# Patient Record
Sex: Female | Born: 1950 | Race: White | Hispanic: No | Marital: Married | Smoking: Never smoker
Health system: Southern US, Community
[De-identification: ages and names within clinical notes are randomized; demographics above are authoritative.]

---

## 2009-09-27 ENCOUNTER — Other Ambulatory Visit: Admission: RE | Admit: 2009-09-27 | Discharge: 2009-09-27 | Payer: Self-pay | Admitting: Family Medicine

## 2017-03-20 ENCOUNTER — Other Ambulatory Visit (HOSPITAL_COMMUNITY)
Admission: RE | Admit: 2017-03-20 | Discharge: 2017-03-20 | Disposition: A | Discharge status: Home / Self Care | Payer: 59 | Source: Ambulatory Visit | Attending: Family Medicine | Admitting: Family Medicine

## 2017-03-20 ENCOUNTER — Other Ambulatory Visit: Payer: Self-pay | Admitting: Family Medicine

## 2017-03-20 DIAGNOSIS — Z01411 Encounter for gynecological examination (general) (routine) with abnormal findings: Secondary | ICD-10-CM | POA: Diagnosis present

## 2017-03-20 DIAGNOSIS — R1310 Dysphagia, unspecified: Secondary | ICD-10-CM

## 2017-03-21 ENCOUNTER — Other Ambulatory Visit: Payer: Self-pay | Admitting: Family Medicine

## 2017-03-21 DIAGNOSIS — Z1231 Encounter for screening mammogram for malignant neoplasm of breast: Secondary | ICD-10-CM

## 2017-03-24 ENCOUNTER — Ambulatory Visit
Admission: RE | Admit: 2017-03-24 | Discharge: 2017-03-24 | Disposition: A | Discharge status: Home / Self Care | Payer: 59 | Source: Ambulatory Visit | Attending: Family Medicine | Admitting: Family Medicine

## 2017-03-24 DIAGNOSIS — R1310 Dysphagia, unspecified: Secondary | ICD-10-CM

## 2017-03-24 LAB — CYTOLOGY - PAP
Diagnosis: NEGATIVE
HPV (WINDOPATH): NOT DETECTED

## 2017-04-22 ENCOUNTER — Encounter: Payer: Self-pay | Admitting: Radiology

## 2017-04-22 ENCOUNTER — Ambulatory Visit
Admission: RE | Admit: 2017-04-22 | Discharge: 2017-04-22 | Disposition: A | Discharge status: Home / Self Care | Payer: 59 | Source: Ambulatory Visit | Attending: Family Medicine | Admitting: Family Medicine

## 2017-04-22 DIAGNOSIS — Z1231 Encounter for screening mammogram for malignant neoplasm of breast: Secondary | ICD-10-CM

## 2020-01-29 ENCOUNTER — Other Ambulatory Visit: Payer: Self-pay

## 2020-01-29 ENCOUNTER — Ambulatory Visit (INDEPENDENT_AMBULATORY_CARE_PROVIDER_SITE_OTHER): Payer: BC Managed Care – PPO

## 2020-01-29 ENCOUNTER — Ambulatory Visit
Admission: RE | Admit: 2020-01-29 | Discharge: 2020-01-29 | Disposition: A | Discharge status: Home / Self Care | Payer: BC Managed Care – PPO | Source: Ambulatory Visit | Attending: Family Medicine | Admitting: Family Medicine

## 2020-01-29 VITALS — BP 136/72 | HR 82 | Temp 99.7°F | Resp 18

## 2020-01-29 DIAGNOSIS — M25561 Pain in right knee: Secondary | ICD-10-CM

## 2020-01-29 DIAGNOSIS — M25461 Effusion, right knee: Secondary | ICD-10-CM

## 2020-01-29 MED ORDER — PREDNISONE 20 MG PO TABS: 40.0000 mg | ORAL_TABLET | Freq: Every day | ORAL | 0 refills | Status: AC

## 2020-01-29 NOTE — ED Triage Notes (Addendum)
Pt sts right sided knee pain x 5 days; pt denies obvious injury

## 2020-01-29 NOTE — Discharge Instructions (Signed)
Follow-up with Dr. Solon Augusta on Monday for evaluation of possible joint effusion drainage.  Take prednisone starting tomorrow with breakfast.  Recommend wearing knee brace with any activity may remove at bedtime.  Also start ice applications this evening and apply ice application for period of 20 minutes twice daily.

## 2020-01-29 NOTE — ED Provider Notes (Addendum)
[facility]    CSN: [id number] Arrival date & time: 01/29/20  1457      History   Chief Complaint Chief Complaint  Patient presents with  . Appointment    1500  . Knee Pain    HPI Rachael Pacheco is a 69 y.o. female.   HPI  Patient presents for evaluation of right lateral knee pain. No known injury. Pain in right present x 5 days. Pain radiates up to lower thigh. 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 No prior issues with recurrent knee pain. Pain is present with weightbearing. She has not  noticed any bruising or obvious bruising.   History reviewed. No pertinent past medical history.  There are no problems to display for this patient.   History reviewed. No pertinent surgical history.  OB History   No obstetric history on file.      Home Medications    Prior to Admission medications   Not on File    Family History Family History  Family history unknown: Yes    Social History Social History   Tobacco Use  . Smoking status: Never Smoker  . Smokeless tobacco: Never Used  Substance Use Topics  . Alcohol use: Not Currently  . Drug use: Not Currently     Allergies   Penicillins Review of Systems Review of Systems Pertinent negatives listed in HPI   Physical Exam Triage Vital Signs ED Triage Vitals  Enc Vitals Group     BP 01/29/20 1620 136/72     Pulse Rate 01/29/20 1620 82     Resp 01/29/20 1620 18     Temp 01/29/20 1620 99.7 F (37.6 C)     Temp Source 01/29/20 1620 Oral     SpO2 01/29/20 1620 96 %     Weight --      Height --      Head Circumference --      Peak Flow --      Pain Score 01/29/20 1621 6     Pain Loc --      Pain Edu? --      Excl. in GC? --    No data found.  Updated Vital Signs BP 136/72 (BP Location: Left Arm)   Pulse 82   Temp 99.7 F (37.6 C) (Oral)   Resp 18   SpO2 96%   Visual Acuity Right Eye Distance:   Left Eye Distance:   Bilateral Distance:    Right Eye  Near:   Left Eye Near:    Bilateral Near:     Physical Exam Constitutional:      Appearance: She is not ill-appearing or diaphoretic.  Cardiovascular:     Rate and Rhythm: Normal rate and regular rhythm.  Pulmonary:     Effort: Pulmonary effort is normal.     Breath sounds: Normal breath sounds.  Musculoskeletal:     Right knee: Swelling present. Tenderness present over the medial joint line.  Neurological:     General: No focal deficit present.     Mental Status: She is alert. Mental status is at baseline.     Comments: Gait abnormal related right knee pain  Psychiatric:        Attention and Perception: Attention and perception normal.        Mood and Affect: Mood normal.    UC Treatments / Results  Labs (all labs ordered are listed, but only abnormal results are displayed) Labs Reviewed -  No data to display  EKG   Radiology DG Knee Complete 4 Views Right  Result Date: 01/29/2020 CLINICAL DATA:  Right knee pain. EXAM: RIGHT KNEE - COMPLETE 4+ VIEW COMPARISON:  None. FINDINGS: There is no acute displaced fracture or dislocation, however evaluation is limited by osteopenia. 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 There is a moderate to large joint effusion. There is mild edema in Hoffa's fat pad. IMPRESSION: 1. No acute displaced fracture or dislocation, however evaluation is limited by osteopenia. 2. Moderate to large joint effusion. If there is clinical concern for internal derangement, follow-up with MRI is recommended. 3. Mild tricompartmental degenerative changes. Electronically Signed   By: Katherine Mantle M.D.   On: 01/29/2020 16:45    Procedures Procedures (including critical care time)  Medications Ordered in UC Medications - No data to display  Initial Impression / Assessment and Plan / UC Course  I have reviewed the triage vital signs and the nursing notes.  Pertinent labs & imaging results that were available during my care of the patient were  reviewed by me and considered in my medical decision making (see chart for details).   acute knee pain, imaging significant for knee effusion and  Mild arthritic changes present. Knee brace applied. Recommended RICE and short course of prednisone prescribed. Follow-up with sports medicine Dr. Jordan Likes for evaluation and management of knee effusion  Causing knee pain. Final Clinical Impressions(s) / UC Diagnoses   Final diagnoses:  Effusion of bursa of right knee  Acute pain of right knee     Discharge Instructions     Follow-up with Dr. Solon Augusta on Monday for evaluation of possible joint effusion drainage.  Take prednisone starting tomorrow with breakfast.  Recommend wearing knee brace with any activity may remove at bedtime.  Also start ice applications this evening and apply ice application for period of 20 minutes twice daily.    ED Prescriptions    Medication Sig Dispense Auth. Provider   predniSONE (DELTASONE) 20 MG tablet Take 2 tablets (40 mg total) by mouth daily with breakfast for 5 days. 10 tablet Bing Neighbors, FNP     PDMP not reviewed this encounter.   Bing Neighbors, FNP 01/31/20 0452    Bing Neighbors, FNP 01/31/20 [phone]

## 2020-08-15 DIAGNOSIS — F411 Generalized anxiety disorder: Secondary | ICD-10-CM | POA: Diagnosis not present

## 2020-10-11 DIAGNOSIS — F411 Generalized anxiety disorder: Secondary | ICD-10-CM | POA: Diagnosis not present

## 2021-01-03 DIAGNOSIS — F411 Generalized anxiety disorder: Secondary | ICD-10-CM | POA: Diagnosis not present

## 2021-03-22 DIAGNOSIS — F411 Generalized anxiety disorder: Secondary | ICD-10-CM | POA: Diagnosis not present

## 2022-02-12 ENCOUNTER — Ambulatory Visit: Payer: Self-pay

## 2022-02-12 DIAGNOSIS — M62830 Muscle spasm of back: Secondary | ICD-10-CM | POA: Diagnosis not present

## 2022-02-12 DIAGNOSIS — M546 Pain in thoracic spine: Secondary | ICD-10-CM | POA: Diagnosis not present

## 2022-02-18 IMAGING — DX DG KNEE COMPLETE 4+V*R*
4 series · 4 of 4 positions shown · non-contrast
Comparison: None.

CLINICAL DATA: Right knee pain.

EXAM:
RIGHT KNEE - COMPLETE 4+ VIEW

[knee ap (1 of 3)]
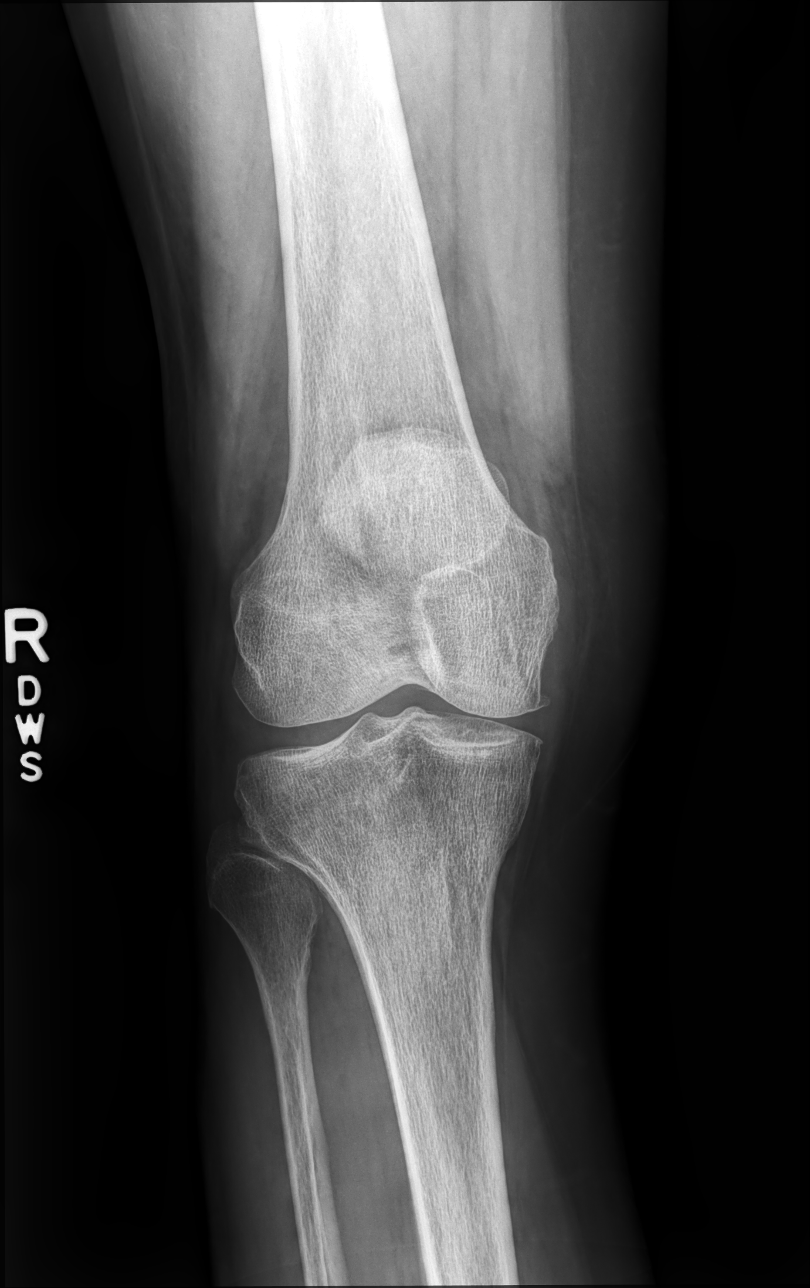

[knee ap (2 of 3)]
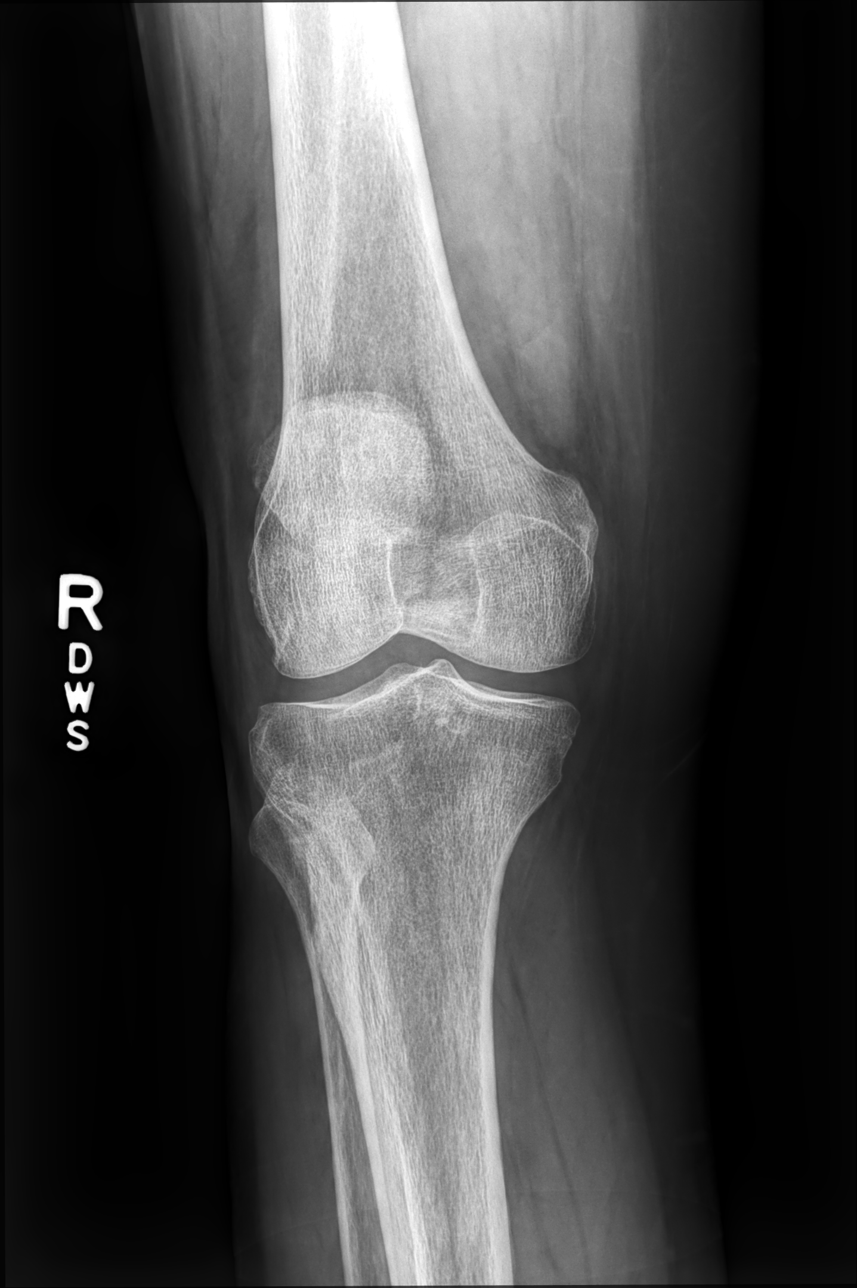

[knee ap (3 of 3)]
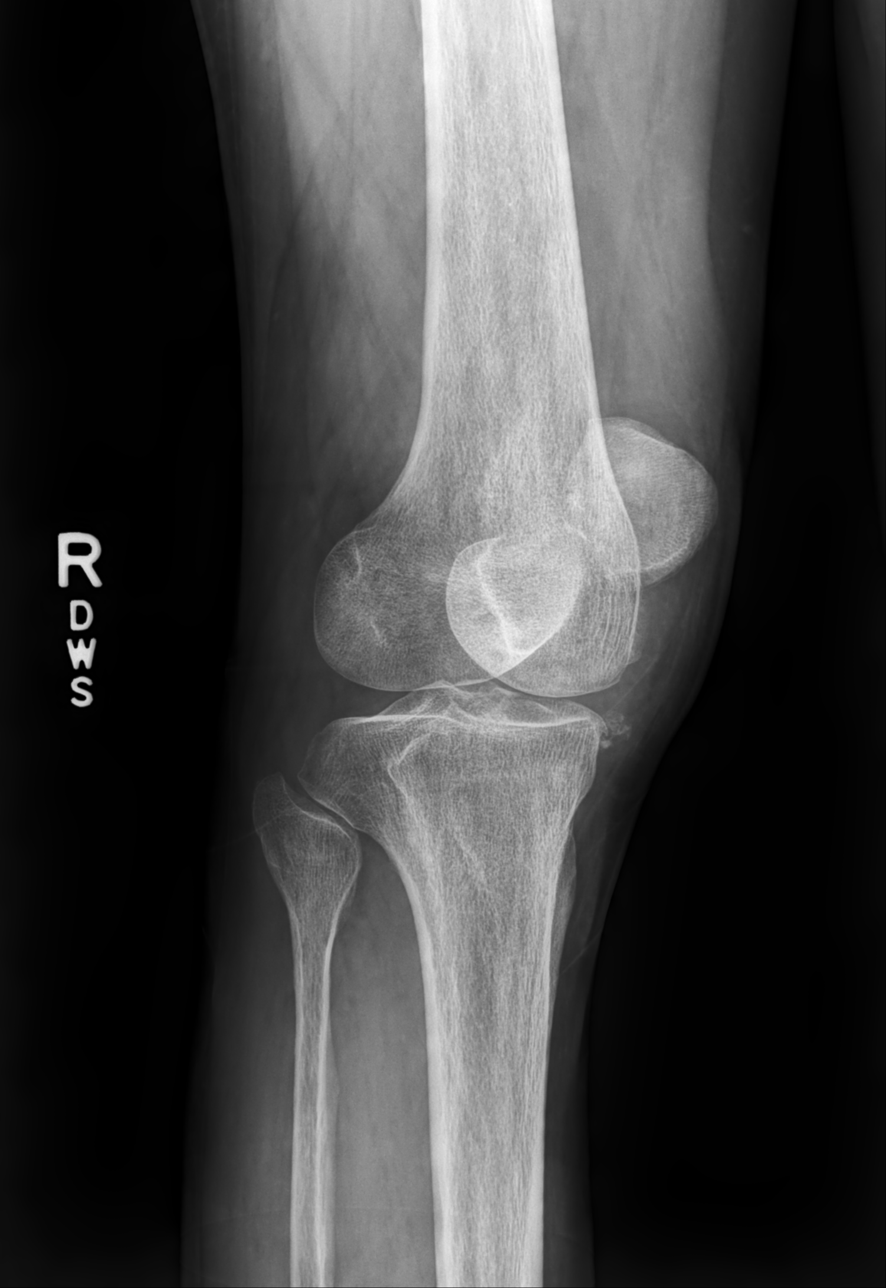

[knee lat]
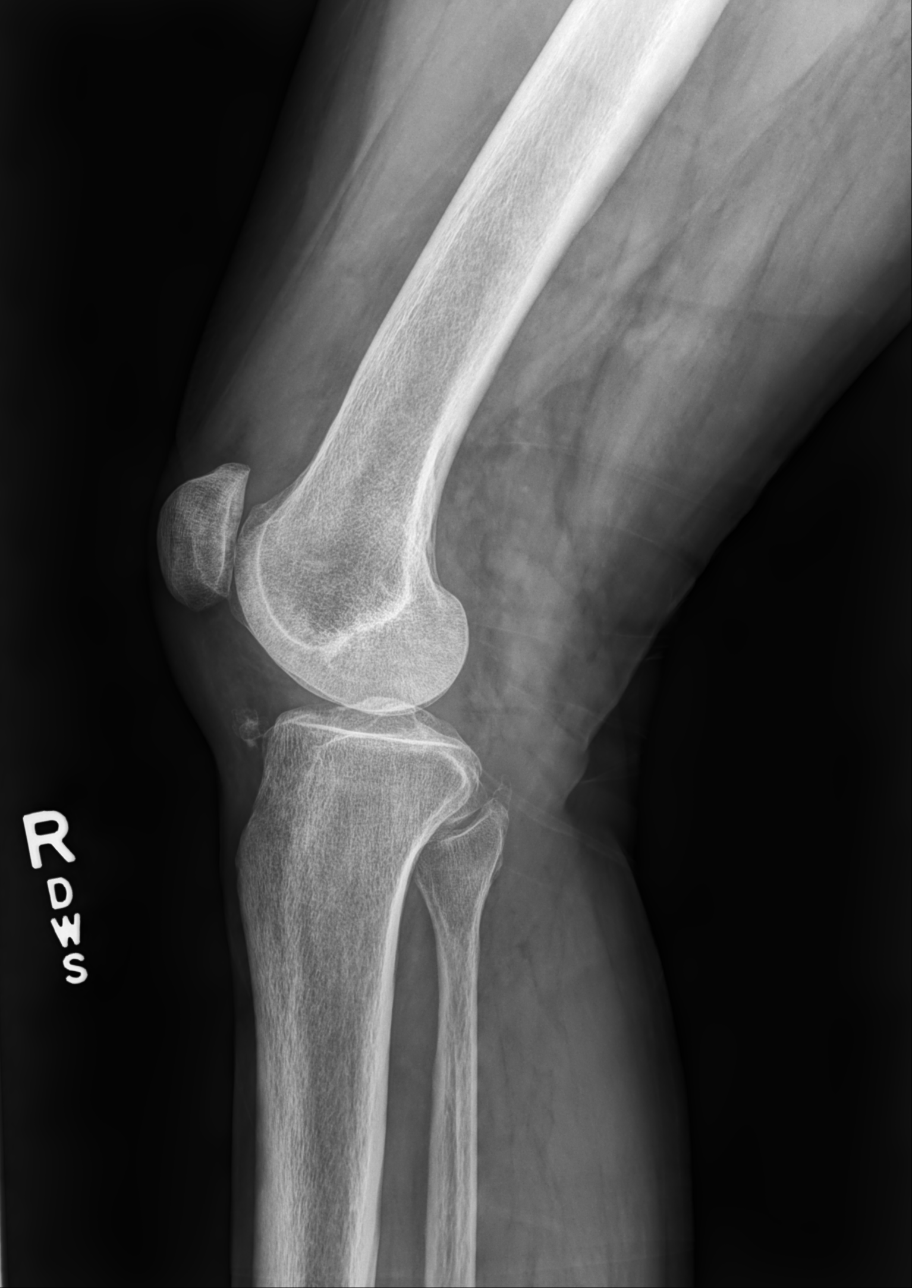

[4 of 4 positions shown; findings below may reference images not displayed]

FINDINGS: There is no acute displaced fracture or dislocation, however
evaluation is limited by osteopenia. Mild tricompartmental
degenerative changes are noted. There is a moderate to large joint
effusion. There is mild edema in Hoffa's fat pad.
IMPRESSION: 1. No acute displaced fracture or dislocation, however evaluation is
limited by osteopenia.
2. Moderate to large joint effusion. If there is clinical concern
for internal derangement, follow-up with MRI is recommended.
3. Mild tricompartmental degenerative changes.

## 2022-04-08 DIAGNOSIS — R69 Illness, unspecified: Secondary | ICD-10-CM | POA: Diagnosis not present

## 2022-05-31 DIAGNOSIS — H5213 Myopia, bilateral: Secondary | ICD-10-CM | POA: Diagnosis not present

## 2022-06-04 DIAGNOSIS — Z1159 Encounter for screening for other viral diseases: Secondary | ICD-10-CM | POA: Diagnosis not present

## 2022-06-04 DIAGNOSIS — R030 Elevated blood-pressure reading, without diagnosis of hypertension: Secondary | ICD-10-CM | POA: Diagnosis not present

## 2022-06-04 DIAGNOSIS — Z1211 Encounter for screening for malignant neoplasm of colon: Secondary | ICD-10-CM | POA: Diagnosis not present

## 2022-06-04 DIAGNOSIS — Z0000 Encounter for general adult medical examination without abnormal findings: Secondary | ICD-10-CM | POA: Diagnosis not present

## 2022-06-04 DIAGNOSIS — E785 Hyperlipidemia, unspecified: Secondary | ICD-10-CM | POA: Diagnosis not present

## 2022-06-04 DIAGNOSIS — E559 Vitamin D deficiency, unspecified: Secondary | ICD-10-CM | POA: Diagnosis not present

## 2022-06-06 DIAGNOSIS — Z1211 Encounter for screening for malignant neoplasm of colon: Secondary | ICD-10-CM | POA: Diagnosis not present

## 2022-07-10 DIAGNOSIS — H25812 Combined forms of age-related cataract, left eye: Secondary | ICD-10-CM | POA: Diagnosis not present

## 2022-08-05 DIAGNOSIS — F429 Obsessive-compulsive disorder, unspecified: Secondary | ICD-10-CM | POA: Diagnosis not present

## 2022-08-05 DIAGNOSIS — E785 Hyperlipidemia, unspecified: Secondary | ICD-10-CM | POA: Diagnosis not present

## 2022-08-05 DIAGNOSIS — E559 Vitamin D deficiency, unspecified: Secondary | ICD-10-CM | POA: Diagnosis not present

## 2022-08-09 DIAGNOSIS — H269 Unspecified cataract: Secondary | ICD-10-CM | POA: Diagnosis not present

## 2022-08-09 DIAGNOSIS — H25812 Combined forms of age-related cataract, left eye: Secondary | ICD-10-CM | POA: Diagnosis not present

## 2022-08-23 DIAGNOSIS — H269 Unspecified cataract: Secondary | ICD-10-CM | POA: Diagnosis not present

## 2022-08-23 DIAGNOSIS — H25811 Combined forms of age-related cataract, right eye: Secondary | ICD-10-CM | POA: Diagnosis not present

## 2022-10-03 DIAGNOSIS — H25811 Combined forms of age-related cataract, right eye: Secondary | ICD-10-CM | POA: Diagnosis not present

## 2022-10-04 DIAGNOSIS — H524 Presbyopia: Secondary | ICD-10-CM | POA: Diagnosis not present

## 2023-06-09 DIAGNOSIS — Z23 Encounter for immunization: Secondary | ICD-10-CM | POA: Diagnosis not present

## 2023-06-09 DIAGNOSIS — E785 Hyperlipidemia, unspecified: Secondary | ICD-10-CM | POA: Diagnosis not present

## 2023-06-09 DIAGNOSIS — Z0000 Encounter for general adult medical examination without abnormal findings: Secondary | ICD-10-CM | POA: Diagnosis not present

## 2023-06-09 DIAGNOSIS — Z1331 Encounter for screening for depression: Secondary | ICD-10-CM | POA: Diagnosis not present

## 2023-06-09 DIAGNOSIS — F429 Obsessive-compulsive disorder, unspecified: Secondary | ICD-10-CM | POA: Diagnosis not present

## 2023-06-09 DIAGNOSIS — Z131 Encounter for screening for diabetes mellitus: Secondary | ICD-10-CM | POA: Diagnosis not present

## 2023-06-09 DIAGNOSIS — E559 Vitamin D deficiency, unspecified: Secondary | ICD-10-CM | POA: Diagnosis not present

## 2023-07-01 DIAGNOSIS — Z1212 Encounter for screening for malignant neoplasm of rectum: Secondary | ICD-10-CM | POA: Diagnosis not present

## 2023-07-01 DIAGNOSIS — Z1211 Encounter for screening for malignant neoplasm of colon: Secondary | ICD-10-CM | POA: Diagnosis not present
